# Patient Record
Sex: Male | Born: 1990 | Race: Black or African American | Hispanic: No | Marital: Single | Smoking: Never smoker
Health system: Southern US, Community
[De-identification: ages and names within clinical notes are randomized; demographics above are authoritative.]

## PROBLEM LIST (undated history)

## (undated) ENCOUNTER — Emergency Department (HOSPITAL_COMMUNITY): Admission: EM | Discharge status: Against Medical Advice | Payer: Self-pay

---
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
## 2010-03-13 ENCOUNTER — Emergency Department (HOSPITAL_COMMUNITY)
Admission: EM | Admit: 2010-03-13 | Discharge: 2010-03-13 | Payer: Self-pay | Source: Home / Self Care | Admitting: Emergency Medicine

## 2010-04-28 ENCOUNTER — Emergency Department (HOSPITAL_COMMUNITY)
Admission: EM | Admit: 2010-04-28 | Discharge: 2010-04-28 | Payer: Self-pay | Source: Home / Self Care | Admitting: Emergency Medicine

## 2010-08-18 NOTE — Op Note (Signed)
Tyler Nash, Tyler Nash                ACCOUNT NO.:  [id number]   MEDICAL RECORD NO.:  [id number]          PATIENT TYPE:  AMB   LOCATION:  DSC                          FACILITY:  [facility]   PHYSICIAN:  Loreta Ave, M.D. 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   PROCEDURE:  Left shoulder exam under anesthesia, arthroscopy,  debridement of labrum, and Hill-Sachs tear.  Arthroscopic assisted  Bankart reconstruction with fiber length suture x3 and swivel lock  anchors x3 at the 1, 3, and 5 o'clock position.  Debridement of labrum.   SURGEON:  Loreta Ave, MD   ASSISTANT:  Genene Churn. Barry Dienes, PA present throughout the entire case  necessary for timely completion of procedure.   ANESTHESIA:  General   BLOOD LOSS:  Minimal.   SPECIMEN:  None.   CULTURES:  None.   COMPLICATIONS:  None.   DRESSING:  Soft compressive with shoulder immobilizer.   PROCEDURE:  The patient brought to the operating room, placed on the  operating table in supine position.  After adequate anesthesia has been  obtained, left shoulder examined.  Obvious anterior instability, but  stable inferiorly and posteriorly.  Full motion.  Placed in beach-chair  position on a shoulder positioner, prepped and draped in usual sterile  fashion.  Two portals; anterior and posterior.  The anterior portal  opened for a large cannula on the front.  The arthroscope introduced,  shoulder distended and inspected.  Labrum and ligamentous attachment  torn off the glenoid from just in front of the biceps tendon all way  down to the bottom of the glenoid.   Some complex tearing of the labrum  debrided.  Capsular ligamentous structures and labral interface  mobilized for repair.  Hill-Sachs lesion on the back of the head  debrided.  Biceps tendon, biceps anchor, rotator cuff all otherwise  intact.  The front of the glenoid was brought down to the bleeding bone.  The capsular labral interface was then captured at the 5 o'clock, 3  o'clock, and 1 o'clock position on separate intervals with passage with  a lasso and then fiber length suture.  The fiber length was then  anchored down in the front of the glenoid in all three places by pre-  drilling and then placement of a swivel lock anchor.  Nice firm  attachment over the entire front of the glenoid with obliteration of  Bankart lesion and reconstruction of the shoulder.  Excellent stability,  full motion.  All sutures have been cut evenly with the age of the  anchor.  The entire shoulder examined, no other findings appreciated.  Instruments fully removed.  Portals injected with Marcaine and closed  with nylon.  Sterile compressive dressing applied.  A shoulder  immobilizer applied.  Anesthesia  reversed.  Brought to the room.  Tolerated surgery well.  No complications.   Next ACL stain and CAT end number [id number] surgery dictation Aug 10, 2007   PREOPERATIVE DIAGNOSES:  Right shoulder impingement, partial versus  complete cuff tear, distal clavicle osteolysis, and spurring.   POSTOPERATIVE DIAGNOSES:  Right shoulder impingement, partial versus  complete cuff tear, distal clavicle osteolysis, and spurring with  partial tearing and thinning of the supraspinatus tendon and the top  margin of the subscap tendon.  No areas of full-thickness tear however.   PROCEDURE:  Right shoulder exam under anesthesia, arthroscopy,  debridement of rotator cuff above and below.  Acromioplasty, bursectomy,  CA ligament release, excision distal clavicle.   SURGEON:  Loreta Ave, MD   ASSISTANT:  Zonia Kief, PA   ANESTHESIA:  General.   BLOOD LOSS:  Minimal.   SPECIMENS:  None.   CULTURES:  None.   COMPLICATIONS:  None.   DRESSINGS:  Soft compressive with sling, right shoulder.   PROCEDURE:  The patient brought to the operating room and after adequate  anesthesia had been obtained, right shoulder examined.  Full motion and  stable shoulder.  We also examined the left shoulder where she had a  previous subscap repair.  Some tightness with rotation, but I could get  through full motion and there were no significant adhesions.  She was  then placed in a beach-chair position on the shoulder positioner,  prepped and draped in the usual sterile fashion.  Three portals  anterior, posterior, and lateral.  Shoulder entered with blunt  obturator.  Arthroscope introduced, shoulder distended and inspected.  Glenohumeral joint looked relatively good.  Little grade 2 changes on  the glenoid.  Partial tearing of the top margin of subscap debrided.  Biceps tendon, biceps anchor, undersurface of the cuff, capsule  ligamentous structures all otherwise intact.  Cannula redirected  subacromially.  Marked reactive bursitis.  A lot of thinning on the top  of the cuff, but again no full-thickness tears.  Thoroughly debrided.  Type 3 acromion.  Acromioplasty to a type 1 acromion with shaver high-  speed bur releasing CA ligament cautery.  Distal clavicle, extensive  periarticular spur removed on both sides.  Grade 4 changes clavicle.  Periarticular spurs lateral centimeter of clavicle resected with shaver  and high-speed bur.  Adequacy of acromioplasty, distal  clavicle excision, cuff debridement confirmed viewing from all portals.  Instruments fully removed.  Portals closed with nylon.  Sterile  compressive dressing applied.  Sling applied.  Anesthesia reversed.  Brought to recovery room.  Tolerated surgery well.  No complications.      Loreta Ave, M.D.  Electronically Signed      DFM/MEDQ  D:  08/10/2007  T:  08/11/2007  Job:  [id number]

## 2011-04-10 ENCOUNTER — Encounter: Payer: Self-pay | Admitting: *Deleted

## 2011-04-10 ENCOUNTER — Emergency Department (HOSPITAL_COMMUNITY)
Admission: EM | Admit: 2011-04-10 | Discharge: 2011-04-10 | Disposition: A | Discharge status: Home / Self Care | Payer: Self-pay | Attending: Emergency Medicine | Admitting: Emergency Medicine

## 2011-04-10 DIAGNOSIS — M25539 Pain in unspecified wrist: Secondary | ICD-10-CM | POA: Insufficient documentation

## 2011-04-10 DIAGNOSIS — M79609 Pain in unspecified limb: Secondary | ICD-10-CM | POA: Insufficient documentation

## 2011-04-10 DIAGNOSIS — G5600 Carpal tunnel syndrome, unspecified upper limb: Secondary | ICD-10-CM | POA: Insufficient documentation

## 2011-04-10 DIAGNOSIS — R209 Unspecified disturbances of skin sensation: Secondary | ICD-10-CM | POA: Insufficient documentation

## 2011-04-10 MED ORDER — IBUPROFEN 800 MG PO TABS: 800.0000 mg | ORAL_TABLET | Freq: Three times a day (TID) | ORAL | Status: AC

## 2011-04-10 MED ORDER — IBUPROFEN 800 MG PO TABS
800.0000 mg | ORAL_TABLET | Freq: Once | ORAL | Status: AC
  Administered 2011-04-10: 800 mg via ORAL
  Filled 2011-04-10: qty 1

## 2011-04-10 NOTE — ED Provider Notes (Addendum)
History     CSN: [id number]  Arrival date & time 04/10/11  4098   First MD Initiated Contact with Patient 04/10/11 0830      Chief Complaint  Patient presents with  . Numbness    (Consider location/radiation/quality/duration/timing/severity/associated sxs/prior treatment) HPI Patient with numbness and tingling to bilateral hands. 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 He states he gets pain in his wrists and hands when he is at work. He states it becomes numb down to through all of his fingers. Sometimes the pain shoots up towards his wrist. It occurs with movement. It resolves when he is not at work. He states his hands feel like they're asleep. He has not had any weakness. History reviewed. No pertinent past medical history.  History reviewed. No pertinent past surgical history.  History reviewed. No pertinent family history.  History  Substance Use Topics  . Smoking status: Not on file  . Smokeless tobacco: Not on file  . Alcohol Use: Yes     occ      Review of Systems  All other systems reviewed and are negative.    Allergies  Review of patient's allergies indicates no known allergies.  Home Medications  No current outpatient prescriptions on file.  BP 120/65  Pulse 91  Temp 98.7 F (37.1 C)  Resp 18  SpO2 98%  Physical Exam  Vitals reviewed. Constitutional: He appears well-developed and well-nourished.  HENT:  Head: Normocephalic and atraumatic.  Eyes: Conjunctivae and EOM are normal. Pupils are equal, round, and reactive to light.  Neck: Normal range of motion. Neck supple.  Pulmonary/Chest: Effort normal.  Musculoskeletal: Normal range of motion.  Neurological: He is alert. He has normal strength and normal reflexes. GCS eye subscore is 4. GCS verbal subscore is 5. GCS motor subscore is 6.  Skin: Skin is warm and dry.  Psychiatric: He has a normal mood and affect.    ED Course  Procedures (including critical care time)  Labs Reviewed - No data to  display No results found.   No diagnosis found.    MDM  Patient symptoms consistent with bilateral carpal tunnel syndrome.          Hilario Quarry, MD 04/10/11 1191  Hilario Quarry, MD 04/10/11 [id number]

## 2011-04-10 NOTE — ED Notes (Signed)
Reports a numbness sensation to bilateral hands "feel like they are still asleep." no neuro deficits noted at triage.

## 2011-04-10 NOTE — ED Notes (Signed)
Ortho tech paged for splints 

## 2011-04-10 NOTE — Progress Notes (Signed)
Orthopedic Tech Progress Note Patient Details:  Tyler Nash [birth year] [id number]   Applied bilateral wrist splints  Gaye Pollack 04/10/2011, 9:09 AM

## 2012-04-17 ENCOUNTER — Encounter (HOSPITAL_COMMUNITY): Payer: Self-pay | Admitting: *Deleted

## 2012-04-17 ENCOUNTER — Emergency Department (HOSPITAL_COMMUNITY)
Admission: EM | Admit: 2012-04-17 | Discharge: 2012-04-17 | Disposition: A | Discharge status: Home / Self Care | Payer: Self-pay | Attending: Emergency Medicine | Admitting: Emergency Medicine

## 2012-04-17 DIAGNOSIS — Z0289 Encounter for other administrative examinations: Secondary | ICD-10-CM | POA: Insufficient documentation
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
## 2012-04-17 NOTE — ED Notes (Signed)
Pt angry when told we would not be able to complete the paperwork he had from the [facility] after waiting so long.

## 2012-04-18 NOTE — ED Provider Notes (Signed)
Medical screening examination/treatment/procedure(s) were performed by non-physician practitioner and as supervising physician I was immediately available for consultation/collaboration.   Nick Stults B. Americus Scheurich, MD 04/18/12 0710 

## 2016-11-14 ENCOUNTER — Encounter (HOSPITAL_COMMUNITY): Payer: Self-pay

## 2016-11-14 ENCOUNTER — Emergency Department (HOSPITAL_COMMUNITY)
Admission: EM | Admit: 2016-11-14 | Discharge: 2016-11-14 | Disposition: A | Discharge status: Home / Self Care | Payer: 59 | Attending: Emergency Medicine | Admitting: Emergency Medicine

## 2016-11-14 DIAGNOSIS — J029 Acute pharyngitis, unspecified: Secondary | ICD-10-CM | POA: Insufficient documentation

## 2016-11-14 MED ORDER — GI COCKTAIL ~~LOC~~
30.0000 mL | Freq: Once | ORAL | Status: AC
  Administered 2016-11-14: 30 mL via ORAL
  Filled 2016-11-14: qty 30

## 2016-11-14 MED ORDER — IBUPROFEN 400 MG PO TABS: 400.0000 mg | ORAL_TABLET | Freq: Four times a day (QID) | ORAL | 0 refills | Status: AC | PRN

## 2016-11-14 MED ORDER — RANITIDINE HCL 150 MG PO CAPS: 150.0000 mg | ORAL_CAPSULE | Freq: Every day | ORAL | 0 refills | Status: AC

## 2016-11-14 NOTE — ED Provider Notes (Signed)
[facility]-EMERGENCY DEPT Provider Note   CSN: [id number] Arrival date & time: 11/14/16  1746  By signing my name below, I, Linna Darner, attest that this documentation has been prepared under the direction and in the presence of Will Merlean Pizzini, PA-C. Electronically Signed: Linna Darner, Scribe. 11/14/2016. 8:34 PM.  History   Chief Complaint Chief Complaint  Patient presents with  . Sore Throat   The history is provided by the patient. No language interpreter was used.    HPI Comments: Tyler Nash is a 26 y.o. male who presents to the Emergency Department for evaluation of a persistent sore throat that began a few hours ago. He drank alcohol last night and awoke this morning with nausea and vomiting. His nausea and vomiting persisted throughout this morning and afternoon, but he has not vomited since the early evening. He reports the nausea and vomiting has resolved now. When he was eating dinner tonight he experienced a sore throat with swallowing food. He did not regurgitate any of the food during dinner. The sore throat has persisted and he decided to come to the ED for evaluation. Patient denies any dysphagia, abdominal pain, fevers, chills, dysuria, urinary retention, or any other associated symptoms.  History reviewed. No pertinent past medical history.  There are no active problems to display for this patient.   History reviewed. No pertinent surgical history.     Home Medications    Prior to Admission medications   Medication Sig Start Date End Date Taking? Authorizing Provider  ibuprofen (ADVIL,MOTRIN) 400 MG tablet Take 1 tablet (400 mg total) by mouth every 6 (six) hours as needed. 11/14/16   Everlene Farrier, PA-C  ranitidine (ZANTAC) 150 MG capsule Take 1 capsule (150 mg total) by mouth daily. 11/14/16   Everlene Farrier, PA-C    Family History History reviewed. No pertinent family history.  Social History Social History  Substance Use Topics  . Smoking status: Never  Smoker  . Smokeless tobacco: Never Used  . Alcohol use Yes     Comment: occ     Allergies   Patient has no known allergies.   Review of Systems Review of Systems  Constitutional: Negative for chills and fever.  HENT: Positive for sore throat. Negative for drooling, facial swelling, trouble swallowing and voice change.   Gastrointestinal: Positive for nausea and vomiting. Negative for abdominal pain.  Genitourinary: Negative for difficulty urinating and dysuria.  Neurological: Negative for dizziness and light-headedness.   Physical Exam Updated Vital Signs BP 128/71 (BP Location: Right Arm)   Pulse 90   Temp 98.8 F (37.1 C) (Oral)   Resp 18   SpO2 100%   Physical Exam  Constitutional: He appears well-developed and well-nourished. No distress.  HENT:  Head: Normocephalic and atraumatic.  Mouth/Throat: No oropharyngeal exudate.  No tonsillar hypertrophy or exudates. Uvula is midline without edema. No PTA, trismus, or drooling. Mild posterior oropharyngeal erythema.  Eyes: Pupils are equal, round, and reactive to light. Conjunctivae are normal. Right eye exhibits no discharge. Left eye exhibits no discharge.  Neck: Neck supple.  Cardiovascular: Normal rate, regular rhythm, normal heart sounds and intact distal pulses.   Pulmonary/Chest: Effort normal and breath sounds normal. No respiratory distress.  Abdominal: Soft. There is no tenderness.  Lymphadenopathy:    He has no cervical adenopathy.  Neurological: He is alert. Coordination normal.  Skin: Skin is warm and dry. No rash noted. He is not diaphoretic.  Psychiatric: He has a normal mood and affect. His behavior is  normal.  Nursing note and vitals reviewed.  ED Treatments / Results  Labs (all labs ordered are listed, but only abnormal results are displayed) Labs Reviewed - No data to display  EKG  EKG Interpretation None       Radiology No results found.  Procedures Procedures (including critical care  time)  DIAGNOSTIC STUDIES: Oxygen Saturation is 100% on RA, normal by my interpretation.    COORDINATION OF CARE: 8:30 PM Discussed treatment plan with pt at bedside and pt agreed to plan.  Medications Ordered in ED Medications  gi cocktail (Maalox,Lidocaine,Donnatal) (not administered)     Initial Impression / Assessment and Plan / ED Course  I have reviewed the triage vital signs and the nursing notes.  Pertinent labs & imaging results that were available during my care of the patient were reviewed by me and considered in my medical decision making (see chart for details).    This  is a 26 y.o. male who presents to the Emergency Department for evaluation of a persistent sore throat that began a few hours ago. He drank alcohol last night and awoke this morning with nausea and vomiting. His nausea and vomiting persisted throughout this morning and afternoon, but he has not vomited since the early evening. He reports the nausea and vomiting has resolved now. When he was eating dinner tonight he experienced a sore throat with swallowing food. He did not regurgitate any of the food during dinner. The sore throat has persisted and he decided to come to the ED for evaluation. He denies any trouble swallowing or fevers. On exam patient is afebrile and nontoxic appearing. He has some mild posterior oropharyngeal erythema without edema or exudate. No tonsillar hypertrophy. Uvula is midline without edema. No drooling. He is tolerating by mouth without nausea or vomiting. Patient has sore throat after vomiting. We'll discharge with pressure for ibuprofen and Zantac. Return precautions discussed. I advised the patient to follow-up with their primary care provider this week. I advised the patient to return to the emergency department with new or worsening symptoms or new concerns. The patient verbalized understanding and agreement with plan.     Final Clinical Impressions(s) / ED Diagnoses   Final  diagnoses:  Sore throat    New Prescriptions New Prescriptions   IBUPROFEN (ADVIL,MOTRIN) 400 MG TABLET    Take 1 tablet (400 mg total) by mouth every 6 (six) hours as needed.   RANITIDINE (ZANTAC) 150 MG CAPSULE    Take 1 capsule (150 mg total) by mouth daily.   I personally performed the services described in this documentation, which was scribed in my presence. The recorded information has been reviewed and is accurate.      Everlene FarrierDansie, Annagrace Carr, PA-C 11/14/16 2041    Jacalyn LefevreHaviland, Julie, MD 11/14/16 2044
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
